# Patient Record
Sex: Male | Born: 1990 | Race: White | Hispanic: No | Marital: Single | Smoking: Current every day smoker
Health system: Southern US, Community
[De-identification: ages and names within clinical notes are randomized; demographics above are authoritative.]

---

## 2018-01-05 ENCOUNTER — Other Ambulatory Visit: Payer: Self-pay

## 2018-01-05 ENCOUNTER — Encounter (HOSPITAL_COMMUNITY): Payer: Self-pay | Admitting: Emergency Medicine

## 2018-01-05 ENCOUNTER — Emergency Department (HOSPITAL_COMMUNITY)
Admission: EM | Admit: 2018-01-05 | Discharge: 2018-01-06 | Disposition: A | Discharge status: Home / Self Care | Payer: Self-pay | Attending: Emergency Medicine | Admitting: Emergency Medicine

## 2018-01-05 DIAGNOSIS — Y998 Other external cause status: Secondary | ICD-10-CM | POA: Insufficient documentation

## 2018-01-05 DIAGNOSIS — W01198A Fall on same level from slipping, tripping and stumbling with subsequent striking against other object, initial encounter: Secondary | ICD-10-CM | POA: Insufficient documentation

## 2018-01-05 DIAGNOSIS — S022XXA Fracture of nasal bones, initial encounter for closed fracture: Secondary | ICD-10-CM | POA: Insufficient documentation

## 2018-01-05 DIAGNOSIS — Y92012 Bathroom of single-family (private) house as the place of occurrence of the external cause: Secondary | ICD-10-CM | POA: Insufficient documentation

## 2018-01-05 DIAGNOSIS — R51 Headache: Secondary | ICD-10-CM | POA: Insufficient documentation

## 2018-01-05 DIAGNOSIS — R040 Epistaxis: Secondary | ICD-10-CM | POA: Insufficient documentation

## 2018-01-05 DIAGNOSIS — R0789 Other chest pain: Secondary | ICD-10-CM | POA: Insufficient documentation

## 2018-01-05 DIAGNOSIS — Y9389 Activity, other specified: Secondary | ICD-10-CM | POA: Insufficient documentation

## 2018-01-05 DIAGNOSIS — F17200 Nicotine dependence, unspecified, uncomplicated: Secondary | ICD-10-CM | POA: Insufficient documentation

## 2018-01-05 NOTE — ED Triage Notes (Signed)
Pt c/o fall 2 days ago with recurrent nose bleeds. No bleeding at this time. Nose is swollen

## 2018-01-06 ENCOUNTER — Emergency Department (HOSPITAL_COMMUNITY): Payer: Self-pay

## 2018-01-06 LAB — BASIC METABOLIC PANEL
ANION GAP: 8 (ref 5–15)
BUN: 16 mg/dL (ref 6–20)
CALCIUM: 9 mg/dL (ref 8.9–10.3)
CO2: 24 mmol/L (ref 22–32)
Chloride: 106 mmol/L (ref 101–111)
Creatinine, Ser: 0.76 mg/dL (ref 0.61–1.24)
GLUCOSE: 101 mg/dL — AB (ref 65–99)
POTASSIUM: 4.2 mmol/L (ref 3.5–5.1)
Sodium: 138 mmol/L (ref 135–145)

## 2018-01-06 LAB — CBC
HEMATOCRIT: 40.8 % (ref 39.0–52.0)
Hemoglobin: 13.8 g/dL (ref 13.0–17.0)
MCH: 29.3 pg (ref 26.0–34.0)
MCHC: 33.8 g/dL (ref 30.0–36.0)
MCV: 86.6 fL (ref 78.0–100.0)
Platelets: 380 10*3/uL (ref 150–400)
RBC: 4.71 MIL/uL (ref 4.22–5.81)
RDW: 13.1 % (ref 11.5–15.5)
WBC: 11.9 10*3/uL — AB (ref 4.0–10.5)

## 2018-01-06 MED ORDER — METOCLOPRAMIDE HCL 10 MG PO TABS
10.0000 mg | ORAL_TABLET | Freq: Once | ORAL | Status: AC
Start: 2018-01-06 — End: 2018-01-06
  Administered 2018-01-06: 10 mg via ORAL
  Filled 2018-01-06: qty 1

## 2018-01-06 MED ORDER — DIPHENHYDRAMINE HCL 25 MG PO CAPS
25.0000 mg | ORAL_CAPSULE | Freq: Once | ORAL | Status: AC
  Administered 2018-01-06: 25 mg via ORAL
  Filled 2018-01-06: qty 1

## 2018-01-06 MED ORDER — IBUPROFEN 400 MG PO TABS
600.0000 mg | ORAL_TABLET | Freq: Once | ORAL | Status: AC
  Administered 2018-01-06: 600 mg via ORAL
  Filled 2018-01-06: qty 1

## 2018-01-06 NOTE — Discharge Instructions (Signed)
Your CT scan shows that you have a broken nose.  You can follow-up with an ENT doctor in [place] when you return home.  Please take ibuprofen at home as needed for nose pain and headache.  Chest x-ray was reassuring.  Please return to the emergency department if you have any new or concerning symptoms like a nosebleed that will not stop.

## 2018-01-06 NOTE — ED Provider Notes (Signed)
[facility] EMERGENCY DEPARTMENT Provider Note   CSN: [id number] Arrival date & time: 01/05/18  2244     History   Chief Complaint Chief Complaint  Patient presents with  . Fall  . Epistaxis    HPI Maurice Beard is a 27 y.o. male.  HPI   Maurice Beard is a 26yo male with no significant past medical history who presents to the emergency department for evaluation of headache and epistaxis after falling 2 days ago.  Patient reports that he was in his bathroom and slipped on the wet floor.  States he fell forward and hit his eye on the sink faucet and his nose hit the sink as well.  Denies losing consciousness.  States that he initially had a nosebleed which lasted for 4 hours.  Reports yesterday he accidentally hit the side of his nose and it started bleeding again.  He has not had any nosebleed since yesterday morning.  Denies taking any blood thinners.  Reports pain with any movement of the bridge of his nose.  He also reports that he has had a headache behind his bilateral eyes since the injury.  The headache seems to come and go, it is particularly worsened with bright light.  Pain is reported as "throbbing" in nature and a 6/10 in severity at this time.  He has not taken any over-the-counter medications for headache.  Denies vision changes, numbness, weakness, nausea/vomiting, neck pain.  He also adds that he has had chest tightness for several years now which seems to come and go and is usually triggered by feeling "stressed out." Sometimes feels like a "popping sensation" in his chest.  He denies fevers, chills, cough, shortness of breath, wheezing.  He has never had to use an inhaler.  History reviewed. No pertinent past medical history.  There are no active problems to display for this patient.   History reviewed. No pertinent surgical history.      Home Medications    Prior to Admission medications   Not on File    Family History No family history on  file.  Social History Social History   Tobacco Use  . Smoking status: Current Every Day Smoker  . Smokeless tobacco: Never Used  Substance Use Topics  . Alcohol use: Yes  . Drug use: Never     Allergies   Patient has no known allergies.   Review of Systems Review of Systems  Constitutional: Negative for chills and fever.  HENT: Positive for nosebleeds. Negative for congestion, sinus pressure and sinus pain.   Eyes: Positive for photophobia. Negative for visual disturbance.  Respiratory: Positive for chest tightness. Negative for cough, shortness of breath and wheezing.   Cardiovascular: Negative for chest pain and palpitations.  Gastrointestinal: Negative for abdominal pain, nausea and vomiting.  Genitourinary: Negative for difficulty urinating.  Musculoskeletal: Negative for gait problem, neck pain and neck stiffness.  Skin: Positive for color change (right eye ecchymosis). Negative for rash.  Neurological: Positive for headaches. Negative for dizziness, weakness, light-headedness and numbness.  Psychiatric/Behavioral: Negative for agitation.     Physical Exam Updated Vital Signs BP 127/75 (BP Location: Right Arm)   Pulse 88   Temp 98.9 F (37.2 C)   Resp 16   SpO2 100%   Physical Exam  Constitutional: He is oriented to person, place, and time. He appears well-developed and well-nourished. No distress.  HENT:  Head: Normocephalic.  Right eye with ecchymosis. No tenderness surrounding the orbits. Nose appears swollen and  turned toward the left. Acutely tender to palpation over the nasal bridge. No nasal septum hematoma. No active bleeding. Bilateral TMs with good cone of light no hemotympanum.   Eyes: Pupils are equal, round, and reactive to light. Conjunctivae and EOM are normal. Right eye exhibits no discharge. Left eye exhibits no discharge.  Neck: Normal range of motion. Neck supple.  No midline cervical spine tenderness.   Cardiovascular: Normal rate, regular  rhythm and intact distal pulses. Exam reveals no friction rub.  No murmur heard. Pulmonary/Chest: Effort normal and breath sounds normal. No stridor. No respiratory distress. He has no wheezes. He has no rales.  Abdominal: Soft. Bowel sounds are normal. There is no tenderness.  Neurological: He is alert and oriented to person, place, and time. Coordination normal.  Mental Status:  Alert, oriented, thought content appropriate, able to give a coherent history. Speech fluent without evidence of aphasia. Able to follow 2 step commands without difficulty.  Cranial Nerves:  II:  Peripheral visual fields grossly normal, pupils equal, round, reactive to light III,IV, VI: ptosis not present, extra-ocular motions intact bilaterally  V,VII: smile symmetric, facial light touch sensation equal VIII: hearing grossly normal to voice  X: uvula elevates symmetrically  XI: bilateral shoulder shrug symmetric and strong XII: midline tongue extension without fassiculations Motor:  Normal tone. 5/5 in upper and lower extremities bilaterally including strong and equal grip strength and dorsiflexion/plantar flexion Sensory: Pinprick and light touch normal in all extremities.  Cerebellar: normal finger-to-nose with bilateral upper extremities Gait: normal gait and balance CV: distal pulses palpable throughout   Skin: Skin is warm and dry. Capillary refill takes less than 2 seconds. He is not diaphoretic.  Psychiatric: He has a normal mood and affect. His behavior is normal.  Nursing note and vitals reviewed.    ED Treatments / Results  Labs (all labs ordered are listed, but only abnormal results are displayed) Labs Reviewed  CBC - Abnormal; Notable for the following components:      Result Value   WBC 11.9 (*)    All other components within normal limits  BASIC METABOLIC PANEL - Abnormal; Notable for the following components:   Glucose, Bld 101 (*)    All other components within normal limits     EKG EKG Interpretation  Date/Time:  Monday January 05 2018 22:54:52 EDT Ventricular Rate:  86 PR Interval:  130 QRS Duration: 94 QT Interval:  348 QTC Calculation: 416 R Axis:   94 Text Interpretation:  Normal sinus rhythm Possible Lateral infarct , age undetermined Abnormal ECG No old tracing to compare Confirmed by Jerelyn ScottLinker, Martha [phone]([id number]) on 01/06/2018 9:33:11 AM   Radiology Dg Chest 2 View  Result Date: 01/06/2018 CLINICAL DATA:  Pain following fall. EXAM: CHEST - 2 VIEW COMPARISON:  None. FINDINGS: Lungs are clear. Heart size and pulmonary vascularity are normal. No adenopathy. No pneumothorax. No bone lesions. IMPRESSION: No edema or consolidation. Electronically Signed   By: Bretta BangWilliam  Woodruff III M.D.   On: 01/06/2018 08:40   Ct Head Wo Contrast  Result Date: 01/06/2018 CLINICAL DATA:  Pain following fall with epistaxis EXAM: CT HEAD WITHOUT CONTRAST TECHNIQUE: Contiguous axial images were obtained from the base of the skull through the vertex without intravenous contrast. COMPARISON:  None. FINDINGS: Brain: The ventricles are normal in size and configuration. The cerebellar tonsils are at the lower limits of normal. There is no intracranial mass, hemorrhage, extra-axial fluid collection, or midline shift. The gray-white compartments appear within normal limits. No  evident acute infarct. Vascular: No hyperdense vessel. No abnormal calcifications are evident. Skull: Bony calvarium appears intact. Sinuses/Orbits: There is opacification in several ethmoid air cells. Other visualized paranasal sinuses are clear. Visualized orbits appear symmetric bilaterally. Other: Visualized mastoid air cells are clear. IMPRESSION: Areas of ethmoid sinus disease.  Study otherwise unremarkable. Electronically Signed   By: Bretta Bang III M.D.   On: 01/06/2018 08:57   Ct Maxillofacial Wo Contrast  Result Date: 01/06/2018 CLINICAL DATA:  Status post fall, with recurrent epistaxis. Nasal swelling.  EXAM: CT MAXILLOFACIAL WITHOUT CONTRAST TECHNIQUE: Multidetector CT imaging of the maxillofacial structures was performed. Multiplanar CT image reconstructions were also generated. COMPARISON:  None. FINDINGS: Osseous: There is a mildly comminuted fracture involving both sides of the nasal bone, more prominent on the left, with mild leftward displacement. There also appears to be rightward displacement of a mildly comminuted fracture through the nasal septum. The maxilla and mandible appear intact. 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 Orbits: The orbits are intact bilaterally. Sinuses: The visualized paranasal sinuses and mastoid air cells are well-aerated. Soft tissues: Soft tissue swelling is noted about the nose. The parapharyngeal fat planes are preserved. The nasopharynx, oropharynx and hypopharynx are unremarkable in appearance. The visualized portions of the valleculae and piriform sinuses are grossly unremarkable. The parotid and submandibular glands are within normal limits. No cervical lymphadenopathy is seen. Limited intracranial: The visualized portions of the brain are unremarkable in appearance. IMPRESSION: 1. Mildly comminuted fracture involving both sides of the nasal bone, more prominent on the left, with mild leftward displacement. Overlying soft tissue swelling noted. 2. Rightward displacement of a mildly comminuted fracture through the nasal septum. 3. Multiple large maxillary and mandibular dental caries noted. Electronically Signed   By: Roanna Raider M.D.   On: 01/06/2018 01:12    Procedures Procedures (including critical care time)  Medications Ordered in ED Medications  metoCLOPramide (REGLAN) tablet 10 mg (10 mg Oral Given 01/06/18 0853)  ibuprofen (ADVIL,MOTRIN) tablet 600 mg (600 mg Oral Given 01/06/18 0853)  diphenhydrAMINE (BENADRYL) capsule 25 mg (25 mg Oral Given 01/06/18 0853)     Initial Impression / Assessment and Plan / ED Course  I have  reviewed the triage vital signs and the nursing notes.  Pertinent labs & imaging results that were available during my care of the patient were reviewed by me and considered in my medical decision making (see chart for details).     CT maxillofacial scan reveals mildly comminuted fracture of both sides of the nasal bone and leftward displacement in addition to nasal septum fracture. No nasal septum hematoma on exam. No active bleeding. CBC unremarkable, hemoglobin stable. BMP unremarkable. CT head without acute abnormality.  In terms of patients reported chest tightness, CXR without acute abnormality. Lungs CTA, VSS. He reports that this has been ongoing for several years and worse when he feels stressed out. Feel that this is likely related to anxiety and do not think further work up is necessary at this time.   Pt HA treated and improved while in ED.  Presentation is non concerning for [facility] or Meningitis. Counseled him that he can use ice over the nose, NSAIDs for pain and follow up with ENT given his nasal fracture. Discussed return precautions and he agrees and voices understanding to the plan and has no complaints prior to discharge.   Final Clinical Impressions(s) / ED Diagnoses   Final diagnoses:  Closed fracture of nasal bone, initial encounter  ED Discharge Orders    None       Lawrence Marseilles 01/07/18 1210    Phillis Haggis, MD 01/08/18 [phone]

## 2018-01-06 NOTE — ED Notes (Signed)
Patient is gone to xray 

## 2019-08-19 IMAGING — CT CT MAXILLOFACIAL W/O CM
3 of 6 series · 15 of 47 positions shown, 18 images · non-contrast
Comparison: None.

CLINICAL DATA: Status post fall, with recurrent epistaxis. Nasal
swelling.

EXAM:
CT MAXILLOFACIAL WITHOUT CONTRAST
TECHNIQUE: Multidetector CT imaging of the maxillofacial structures was
performed. Multiplanar CT image reconstructions were also generated.

[Series 3: maxilllofacial 2.0 hr40 3 · axial · 0.33mm/px · z∈[-226,-76]mm · 10 of 89 slices shown, 13 images]
[im 7/89  brain]
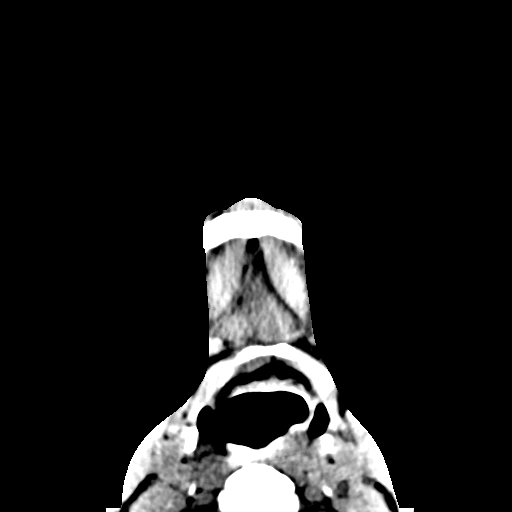
[im 7/89  bone]
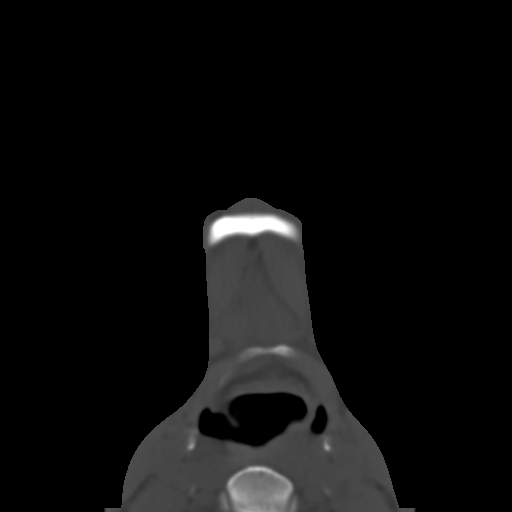
[im 13/89  bone]
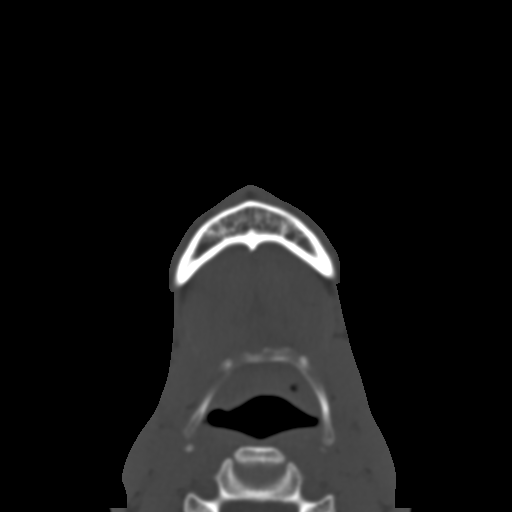
[im 26/89  bone]
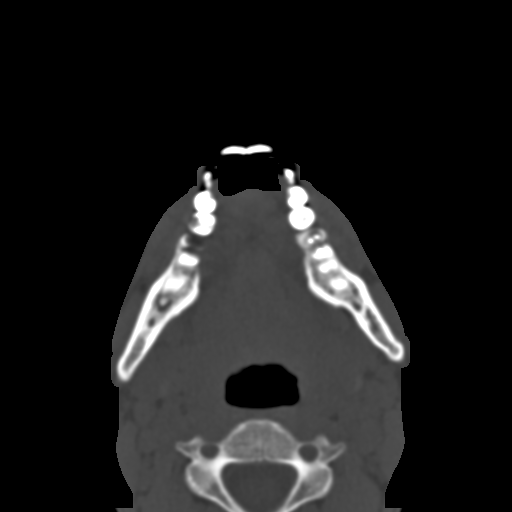
[im 32/89  bone]
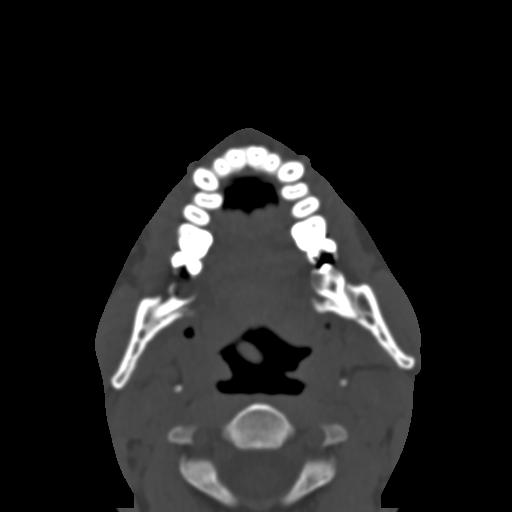
[im 38/89  brain]
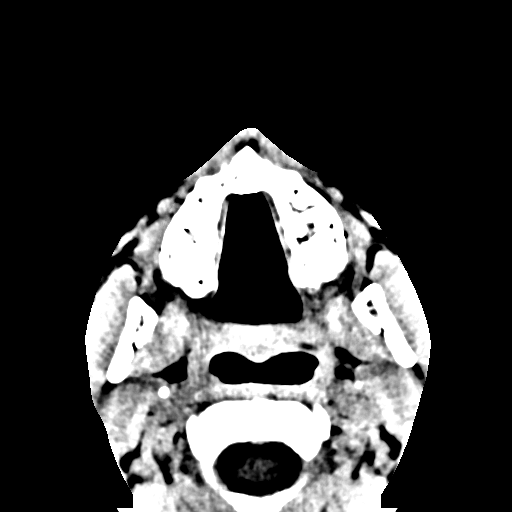
[im 38/89  bone]
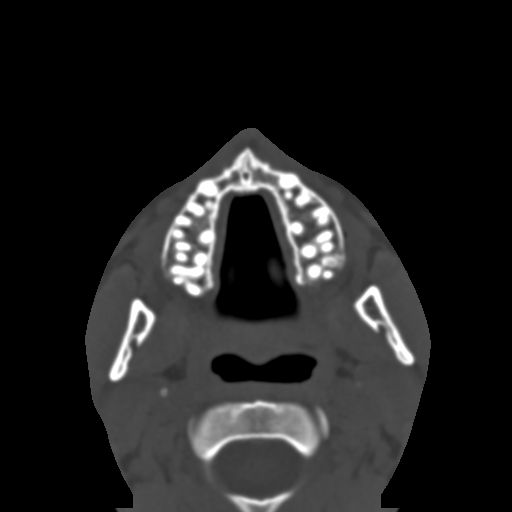
[im 51/89  bone]
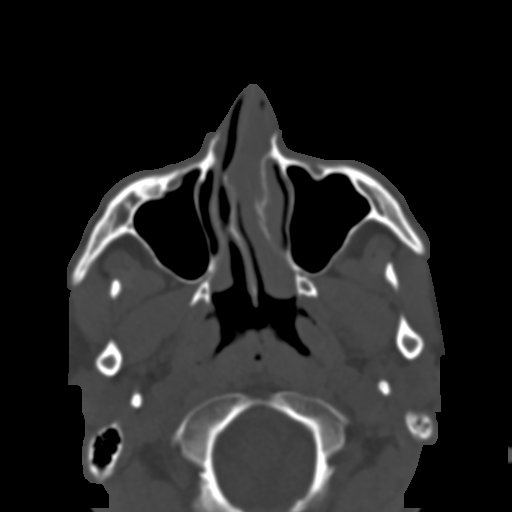
[im 57/89  bone]
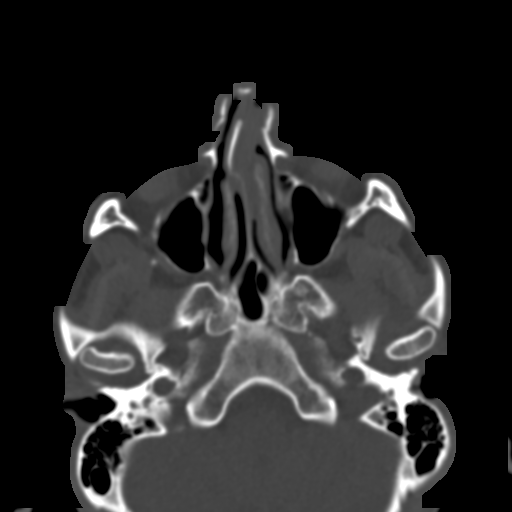
[im 63/89  bone]
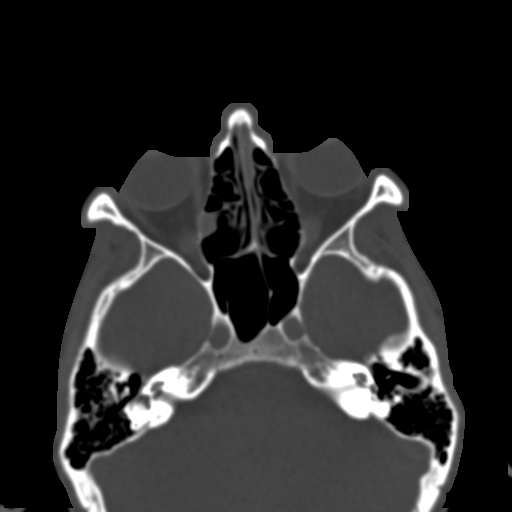
[im 76/89  brain]
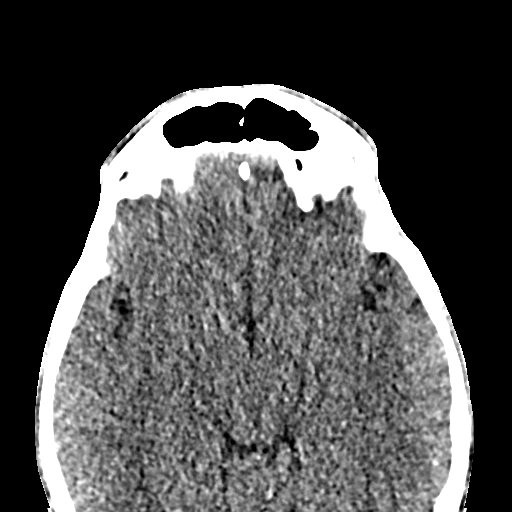
[im 76/89  bone]
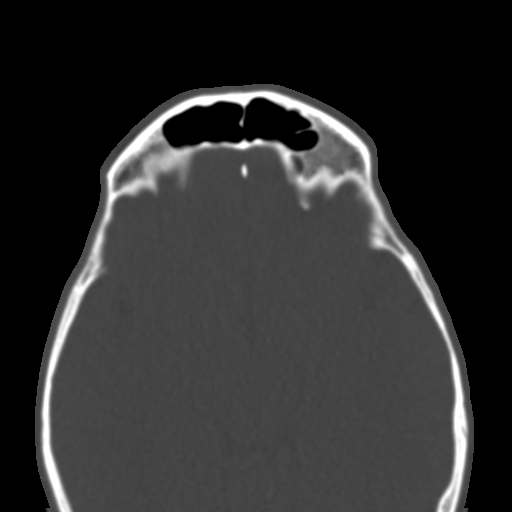
[im 82/89  bone]
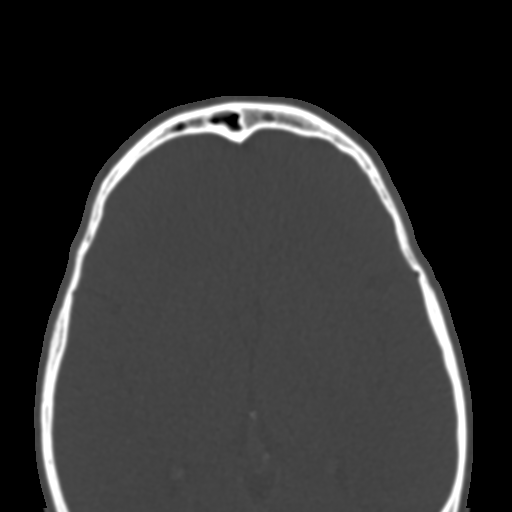

[Series 9: bone cor · coronal · 0.29mm/px · 3 of 76 slices shown]
[im 19/76  bone]
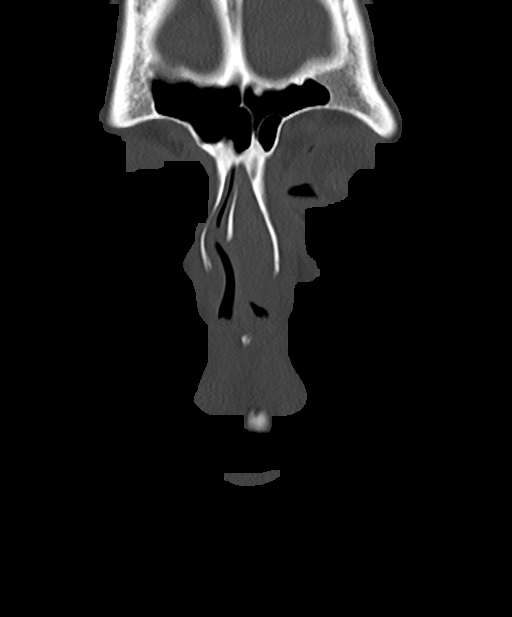
[im 38/76  bone]
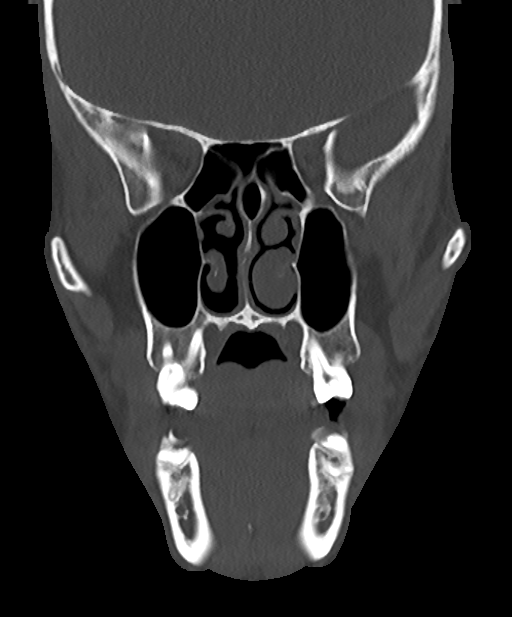
[im 57/76  bone]
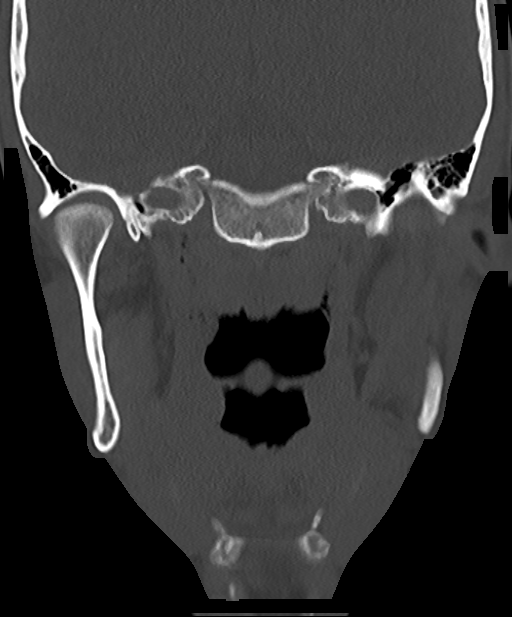

[Series 10: bone sag · sagittal · 0.34mm/px · 2 of 76 slices shown]
[im 26/76  bone]
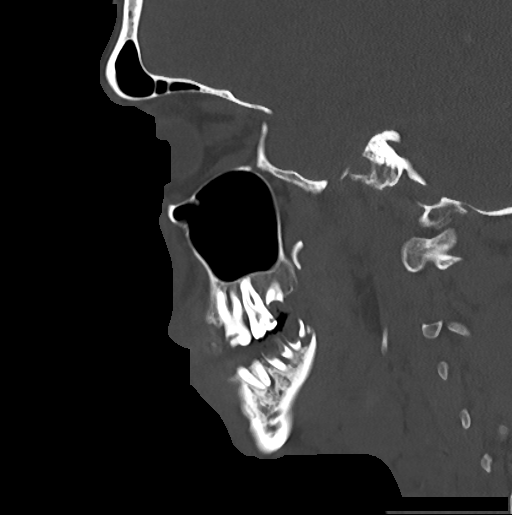
[im 51/76  bone]
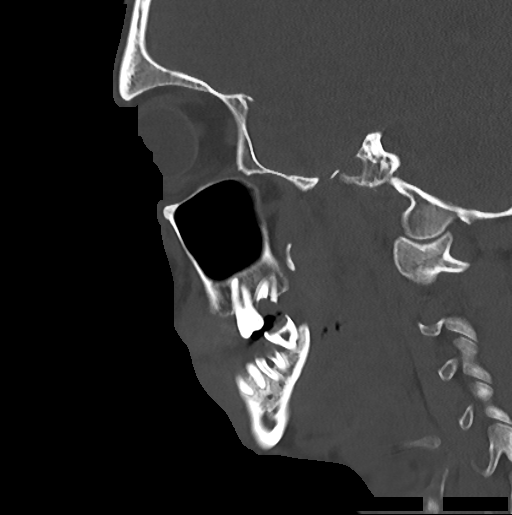

[15 of 47 positions shown; findings below may reference images not displayed]

FINDINGS: Osseous: There is a mildly comminuted fracture involving both sides
of the nasal bone, more prominent on the left, with mild leftward
displacement. There also appears to be rightward displacement of a
mildly comminuted fracture through the nasal septum.

The maxilla and mandible appear intact. Multiple large maxillary and
mandibular dental caries are noted.

Orbits: The orbits are intact bilaterally.

Sinuses: The visualized paranasal sinuses and mastoid air cells are
well-aerated.

Soft tissues: Soft tissue swelling is noted about the nose. The
parapharyngeal fat planes are preserved. The nasopharynx, oropharynx
and hypopharynx are unremarkable in appearance. The visualized
portions of the valleculae and piriform sinuses are grossly
unremarkable.

The parotid and submandibular glands are within normal limits. No
cervical lymphadenopathy is seen.

Limited intracranial: The visualized portions of the brain are
unremarkable in appearance.
IMPRESSION: 1. Mildly comminuted fracture involving both sides of the nasal
bone, more prominent on the left, with mild leftward displacement.
Overlying soft tissue swelling noted.
2. Rightward displacement of a mildly comminuted fracture through
the nasal septum.
3. Multiple large maxillary and mandibular dental caries noted.

## 2019-08-19 IMAGING — CT CT HEAD W/O CM
4 series · 16 of 47 positions shown, 18 images · non-contrast
Comparison: None.

CLINICAL DATA: Pain following fall with epistaxis

EXAM:
CT HEAD WITHOUT CONTRAST
TECHNIQUE: Contiguous axial images were obtained from the base of the skull
through the vertex without intravenous contrast.

[Series 3: head wo · axial · 0.44mm/px · z∈[-150,-50]mm · 7 of 28 slices shown, 9 images]
[im 4/28  brain]
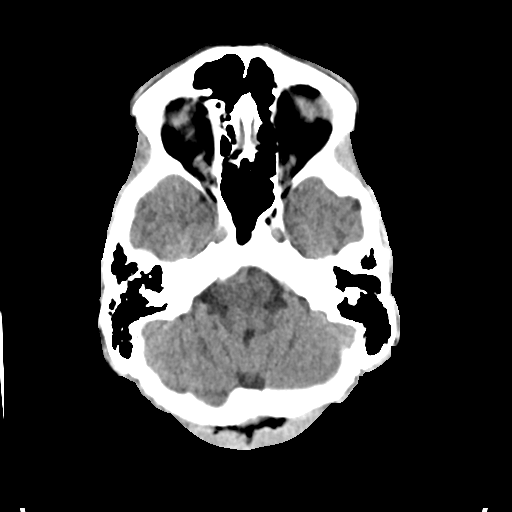
[im 4/28  bone]
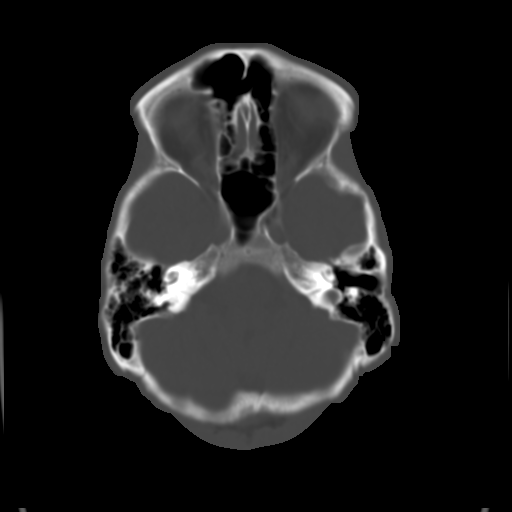
[im 7/28  brain]
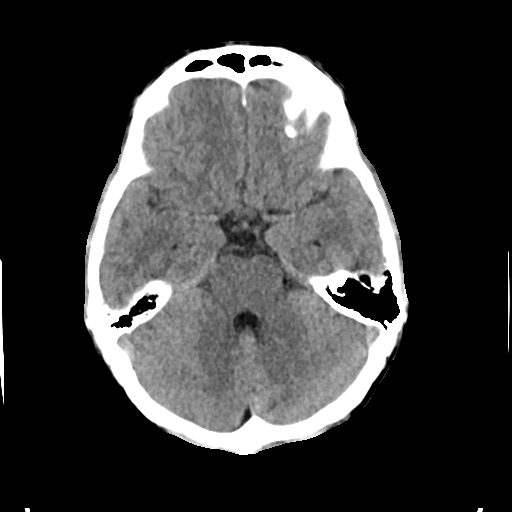
[im 11/28  brain]
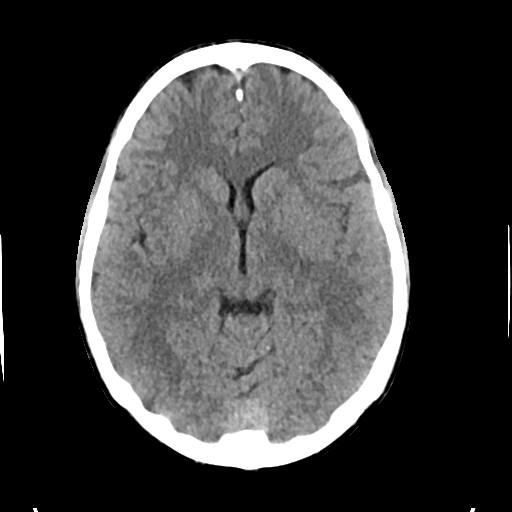
[im 14/28  brain]
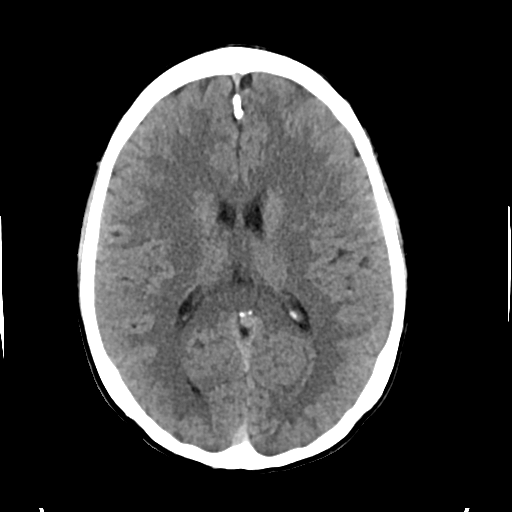
[im 17/28  brain]
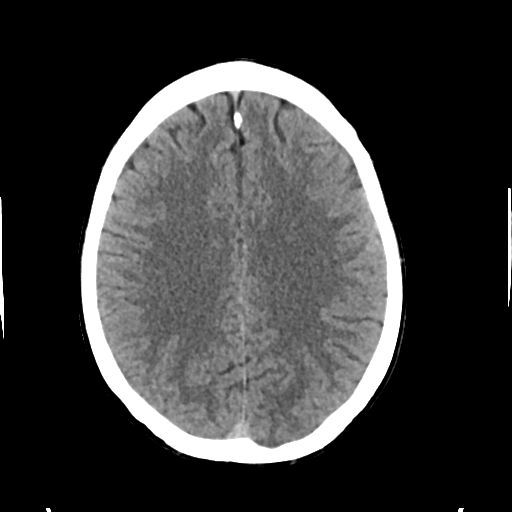
[im 17/28  bone]
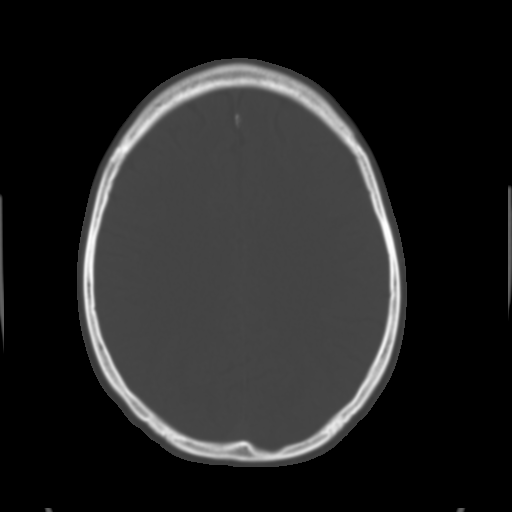
[im 21/28  brain]
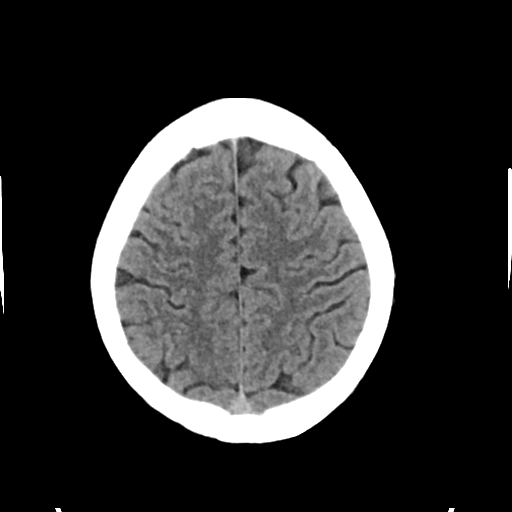
[im 24/28  brain]
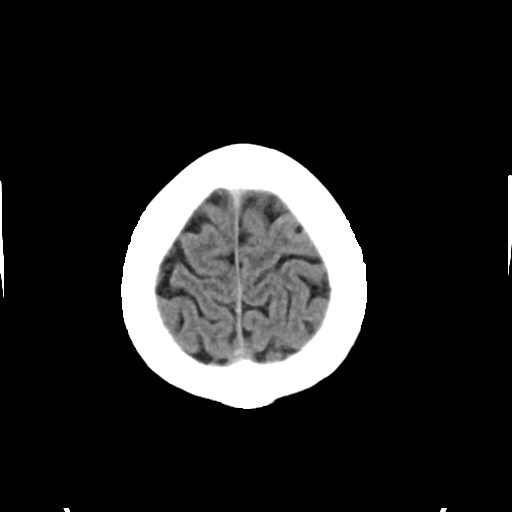

[Series 4: head bone · axial · 0.44mm/px · z∈[-154,-126]mm · 3 of 70 slices shown]
[im 7/70  bone]
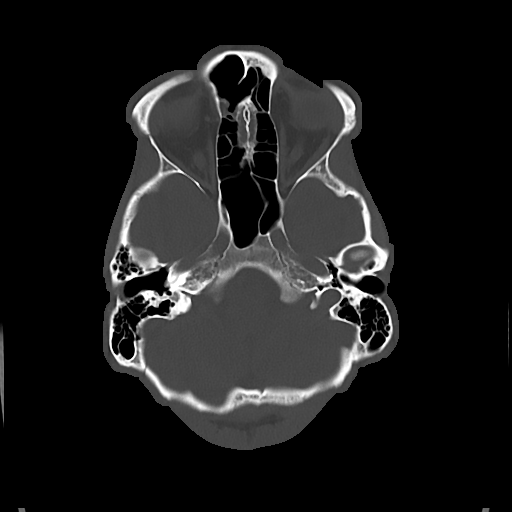
[im 14/70  bone]
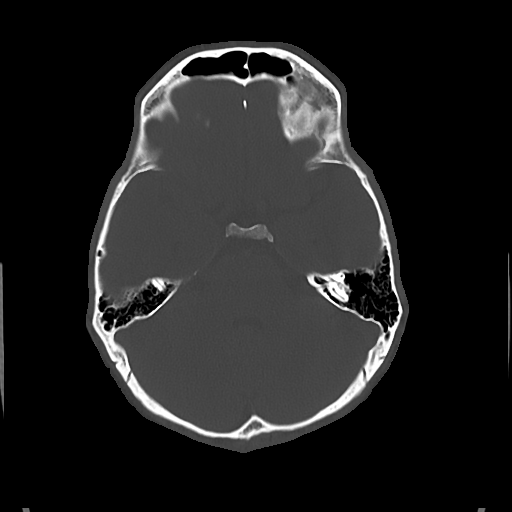
[im 21/70  bone]
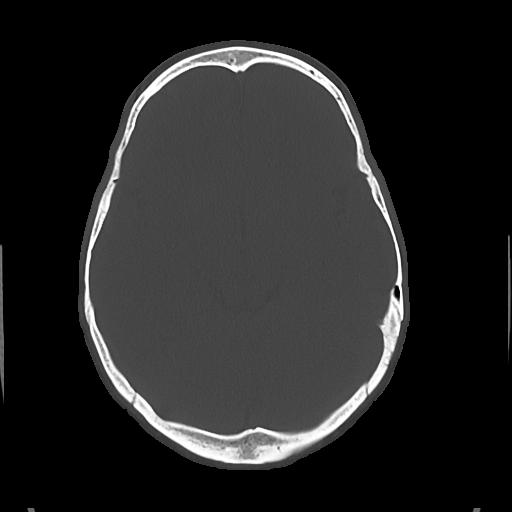

[Series 5: cor soft · coronal · 0.32mm/px · 3 of 71 slices shown]
[im 24/71  brain]
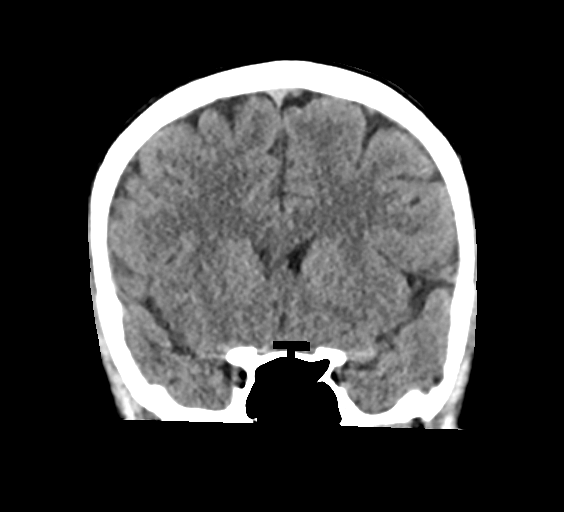
[im 32/71  brain]
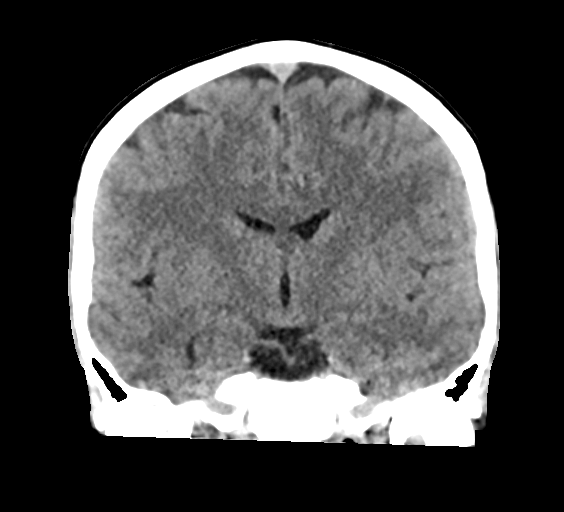
[im 39/71  brain]
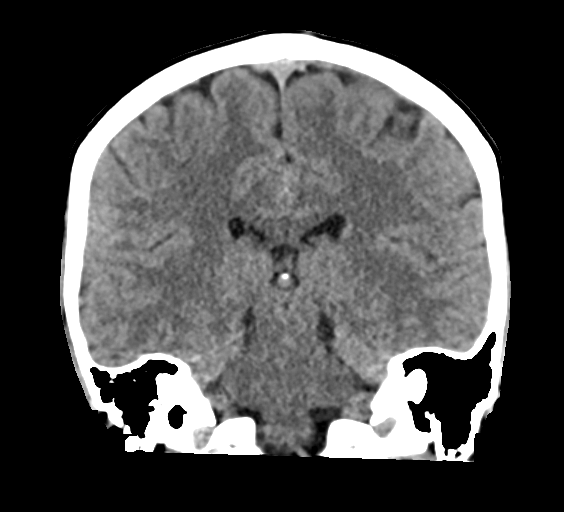

[Series 6: sag soft · sagittal · 0.30mm/px · 3 of 67 slices shown]
[im 23/67  brain]
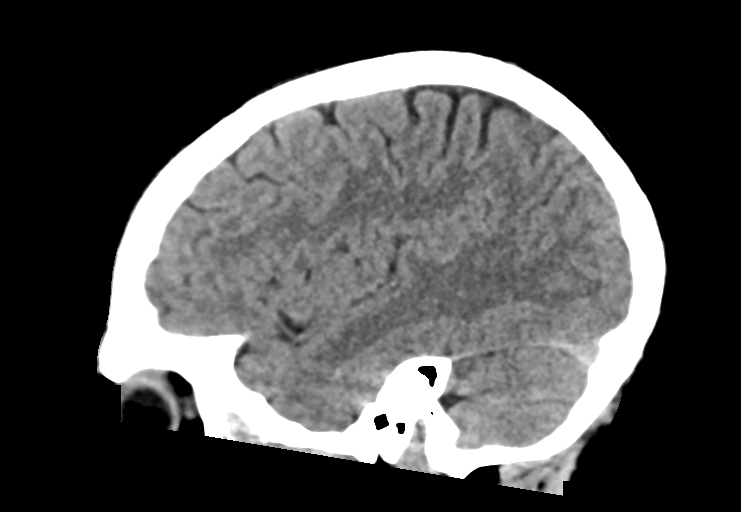
[im 34/67  brain]
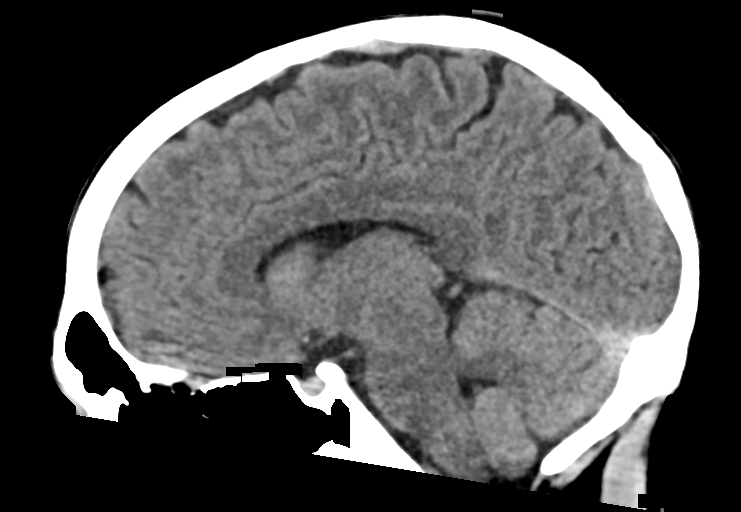
[im 45/67  brain]
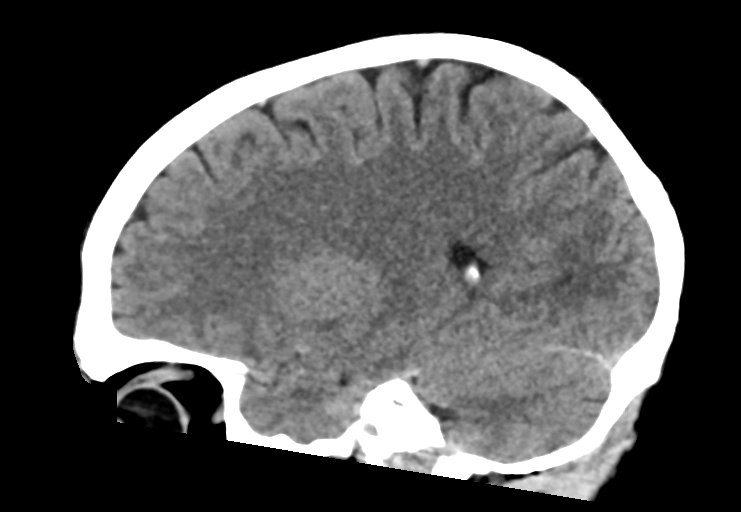

[16 of 47 positions shown; findings below may reference images not displayed]

FINDINGS: Brain: The ventricles are normal in size and configuration. The
cerebellar tonsils are at the lower limits of normal. There is no
intracranial mass, hemorrhage, extra-axial fluid collection, or
midline shift. The gray-white compartments appear within normal
limits. No evident acute infarct.

Vascular: No hyperdense vessel. No abnormal calcifications are
evident.

Skull: Bony calvarium appears intact.

Sinuses/Orbits: There is opacification in several ethmoid air cells.
Other visualized paranasal sinuses are clear. Visualized orbits
appear symmetric bilaterally.

Other: Visualized mastoid air cells are clear.
IMPRESSION: Areas of ethmoid sinus disease.  Study otherwise unremarkable.

## 2020-01-15 DEATH — deceased
